# Patient Record
Sex: Male | Born: 1970 | Race: Black or African American | Hispanic: No | Marital: Married | Smoking: Never smoker
Health system: Southern US, Community
[De-identification: ages and names within clinical notes are randomized; demographics above are authoritative.]

---

## 2015-05-06 ENCOUNTER — Encounter (HOSPITAL_COMMUNITY): Payer: Self-pay

## 2015-05-06 ENCOUNTER — Emergency Department (HOSPITAL_COMMUNITY): Payer: BLUE CROSS/BLUE SHIELD

## 2015-05-06 ENCOUNTER — Emergency Department (HOSPITAL_COMMUNITY)
Admission: EM | Admit: 2015-05-06 | Discharge: 2015-05-06 | Disposition: A | Discharge status: Home / Self Care | Payer: BLUE CROSS/BLUE SHIELD | Attending: Emergency Medicine | Admitting: Emergency Medicine

## 2015-05-06 DIAGNOSIS — R51 Headache: Secondary | ICD-10-CM | POA: Diagnosis present

## 2015-05-06 DIAGNOSIS — H53149 Visual discomfort, unspecified: Secondary | ICD-10-CM | POA: Diagnosis not present

## 2015-05-06 DIAGNOSIS — R110 Nausea: Secondary | ICD-10-CM | POA: Diagnosis not present

## 2015-05-06 DIAGNOSIS — R519 Headache, unspecified: Secondary | ICD-10-CM

## 2015-05-06 DIAGNOSIS — Z79899 Other long term (current) drug therapy: Secondary | ICD-10-CM | POA: Diagnosis not present

## 2015-05-06 MED ORDER — DEXAMETHASONE SODIUM PHOSPHATE 10 MG/ML IJ SOLN
10.0000 mg | Freq: Once | INTRAMUSCULAR | Status: AC
  Administered 2015-05-06: 10 mg via INTRAVENOUS
  Filled 2015-05-06: qty 1

## 2015-05-06 MED ORDER — PROCHLORPERAZINE EDISYLATE 5 MG/ML IJ SOLN
10.0000 mg | Freq: Once | INTRAMUSCULAR | Status: AC
  Administered 2015-05-06: 10 mg via INTRAVENOUS
  Filled 2015-05-06: qty 2

## 2015-05-06 MED ORDER — ONDANSETRON 8 MG PO TBDP: 8.0000 mg | ORAL_TABLET | Freq: Three times a day (TID) | ORAL | Status: AC | PRN

## 2015-05-06 MED ORDER — BUTALBITAL-APAP-CAFFEINE 50-325-40 MG PO TABS: 1.0000 | ORAL_TABLET | Freq: Four times a day (QID) | ORAL | Status: AC | PRN

## 2015-05-06 MED ORDER — SODIUM CHLORIDE 0.9 % IV BOLUS (SEPSIS)
1000.0000 mL | Freq: Once | INTRAVENOUS | Status: AC
  Administered 2015-05-06: 1000 mL via INTRAVENOUS

## 2015-05-06 MED ORDER — DIPHENHYDRAMINE HCL 50 MG/ML IJ SOLN
12.5000 mg | Freq: Once | INTRAMUSCULAR | Status: AC
  Administered 2015-05-06: 12.5 mg via INTRAVENOUS
  Filled 2015-05-06: qty 1

## 2015-05-06 NOTE — ED Provider Notes (Signed)
CSN: [id number]     Arrival date & time 05/06/15  1846 History   First MD Initiated Contact with Patient 05/06/15 1855     Chief Complaint  Patient presents with  . Headache     (Consider location/radiation/quality/duration/timing/severity/associated sxs/prior Treatment) The history is provided by the patient and the spouse.   Jared Matthews is a 45 y.o. male presenting with gradual onset headache starting 2 hours before arriving here.  He has had a history of intermittent headaches for the past year only, which he presumes may be migraine, although has never had headaches evaluated.  Every month or 2 will have a headache that incapacitates him for hours until resolution.  He has photophobia and phonophobia without other visual changes (no scotoma) and he endorses nausea without emesis.  He denies fevers, chills, dizziness, focal weakness.  He historically has just taken a nap in a darkened room and the headache would be better upon awaking.  This headache is similar but more intense than normal.  Denies significant family or personal history.  Denies head injury.     History reviewed. No pertinent past medical history. History reviewed. No pertinent past surgical history. No family history on file. Social History  Substance Use Topics  . Smoking status: Never Smoker   . Smokeless tobacco: None  . Alcohol Use: Yes     Comment: occ    Review of Systems  Constitutional: Negative for fever.  HENT: Negative for congestion and sore throat.   Eyes: Positive for photophobia.  Respiratory: Negative for chest tightness and shortness of breath.   Cardiovascular: Negative for chest pain.  Gastrointestinal: Positive for nausea. Negative for vomiting and abdominal pain.  Genitourinary: Negative.   Musculoskeletal: Negative for joint swelling, arthralgias and neck pain.  Skin: Negative.  Negative for rash and wound.  Neurological: Positive for headaches. Negative for dizziness, weakness,  light-headedness and numbness.  Psychiatric/Behavioral: Negative.       Allergies  Review of patient's allergies indicates no known allergies.  Home Medications   Prior to Admission medications   Medication Sig Start Date End Date Taking? Authorizing Provider  loratadine (CLARITIN) 10 MG tablet Take 10 mg by mouth daily as needed for allergies.   Yes Historical Provider, MD  butalbital-acetaminophen-caffeine (FIORICET) 50-325-40 MG tablet Take 1 tablet by mouth every 6 (six) hours as needed for headache. 05/06/15 05/05/16  Burgess Amor, PA-C  ondansetron (ZOFRAN ODT) 8 MG disintegrating tablet Take 1 tablet (8 mg total) by mouth every 8 (eight) hours as needed for nausea or vomiting. 05/06/15   Burgess Amor, PA-C   BP 142/82 mmHg  Pulse 61  Temp(Src) 98.2 F (36.8 C) (Temporal)  Resp 20  Ht  (1.803 m)  Wt 97.523 kg  BMI 30.00 kg/m2  SpO2 100% Physical Exam  Constitutional: He is oriented to person, place, and time. He appears well-developed and well-nourished.  HENT:  Head: Normocephalic and atraumatic.  Mouth/Throat: Oropharynx is clear and moist.  Eyes: EOM are normal. Pupils are equal, round, and reactive to light.  Neck: Normal range of motion. Neck supple.  Cardiovascular: Normal rate and normal heart sounds.   Pulmonary/Chest: Effort normal.  Abdominal: Soft. There is no tenderness.  Musculoskeletal: Normal range of motion.  Lymphadenopathy:    He has no cervical adenopathy.  Neurological: He is alert and oriented to person, place, and time. He has normal strength. No sensory deficit. Gait normal. GCS eye subscore is 4. GCS verbal subscore is 5. GCS motor  subscore is 6.  Normal heel-shin, normal rapid alternating movements. Cranial nerves III-XII intact.  No pronator drift.  Skin: Skin is warm and dry. No rash noted.  Psychiatric: He has a normal mood and affect. His speech is normal and behavior is normal. Thought content normal. Cognition and memory are normal.   Nursing note and vitals reviewed.   ED Course  Procedures (including critical care time) Labs Review   Imaging Review Ct Head Wo Contrast  05/06/2015  CLINICAL DATA:  Headache over the last 2 hours with vomiting. EXAM: CT HEAD WITHOUT CONTRAST TECHNIQUE: Contiguous axial images were obtained from the base of the skull through the vertex without intravenous contrast. COMPARISON:  None. FINDINGS: No acute cortical infarct, hemorrhage, or mass lesion is present. The ventricles are of normal size. No significant extra-axial fluid collection is evident. The paranasal sinuses and mastoid air cells are clear. The calvarium is intact. The globes and orbits are within normal limits. No significant extracranial soft tissue lesion is present. IMPRESSION: Negative CT of the head. Electronically Signed   By: Marin Robertshristopher  Mattern M.D.   On: 05/06/2015 19:53   I have personally reviewed and evaluated these images and lab results as part of my medical decision-making.    MDM   Final diagnoses:  Acute nonintractable headache, unspecified headache type    Pt was given IV fluids, compazine, benadryl and decadron with complete resolution of headache, nausea with no further emesis.  He had one episode of vomiting just prior to receiving injections.  He was alert, comfortable, stable for dc home. Normal neurologic findings on exam, CT negative for acute process.  Suspect migraine headache.  He was given prescription for zofran, Fioricet for prn use. Referral to neurology for further evaluation of headache pattern.      Burgess AmorJulie Finnlee Guarnieri, PA-C 05/07/15 0036  Mancel BaleElliott Wentz, MD 05/07/15 [phone]

## 2015-05-06 NOTE — ED Notes (Signed)
Patient verbalizes understanding of discharge instructions, prescription medications, home care and follow up care. Patient out of department at this time with family. 

## 2015-05-06 NOTE — ED Notes (Signed)
Emesis X1 at this time.  

## 2015-05-06 NOTE — Discharge Instructions (Signed)

## 2015-05-06 NOTE — ED Notes (Signed)
Pt c/o headache x 2 hours.  Reports nausea, no vomiting.  Reports history of migraines.

## 2016-10-20 IMAGING — CT CT HEAD W/O CM
1 series · 16 of 30 positions shown, 20 images · non-contrast
Comparison: None.

CLINICAL DATA: Headache over the last 2 hours with vomiting.

EXAM:
CT HEAD WITHOUT CONTRAST
TECHNIQUE: Contiguous axial images were obtained from the base of the skull
through the vertex without intravenous contrast.

[Series 2: headseq 4.8 h37s · axial · 0.43mm/px · z∈[+84,+241]mm · 16 of 36 slices shown, 20 images]
[im 2/36  brain]
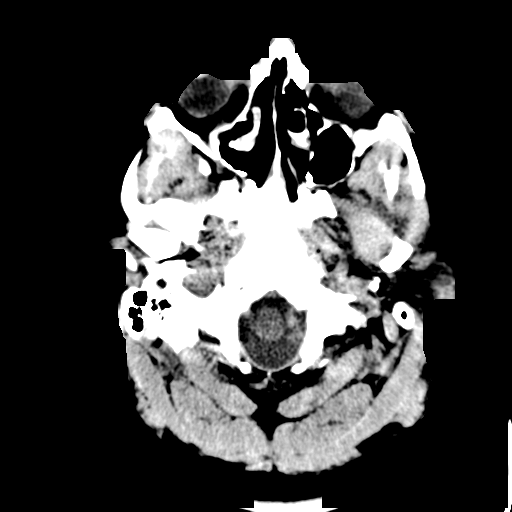
[im 2/36  bone]
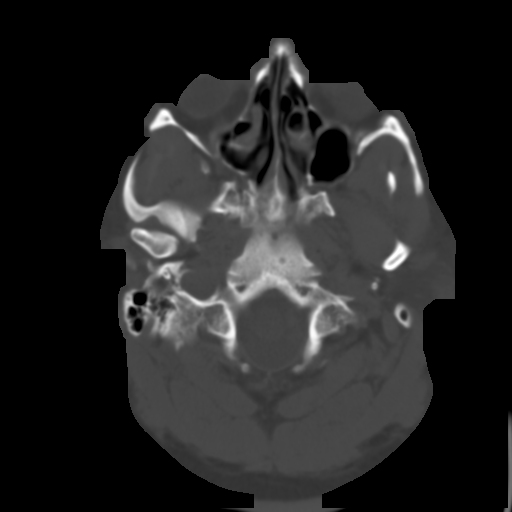
[im 4/36  brain]
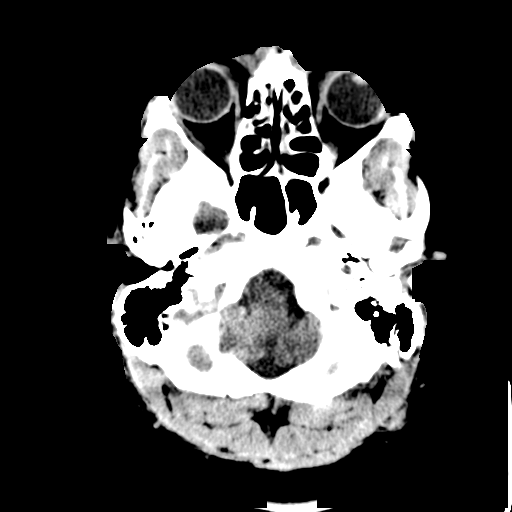
[im 7/36  brain]
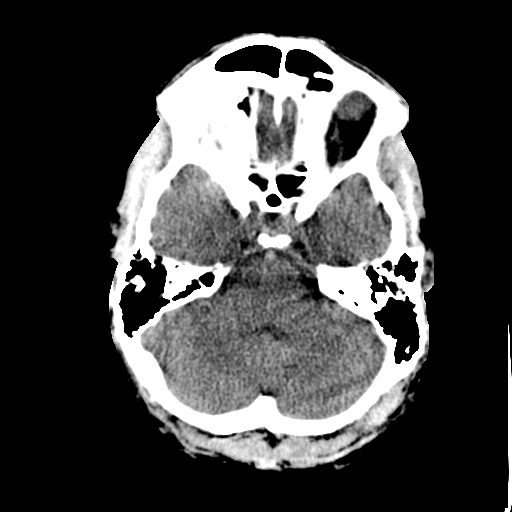
[im 9/36  brain]
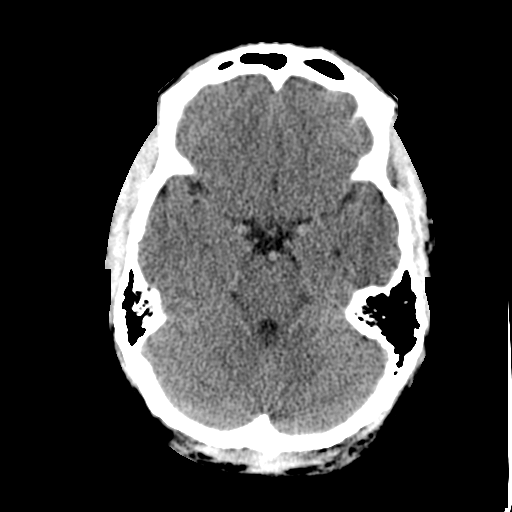
[im 10/36  brain]
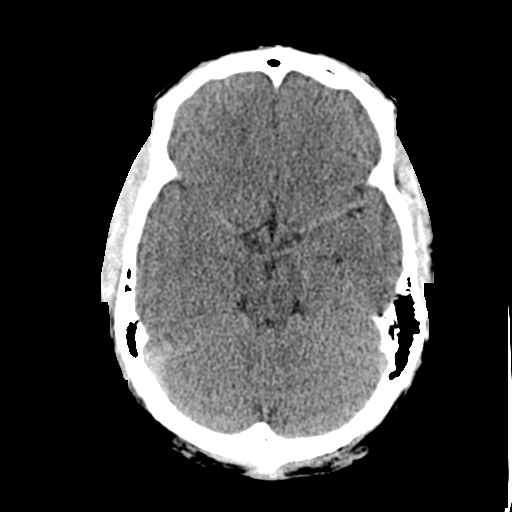
[im 10/36  bone]
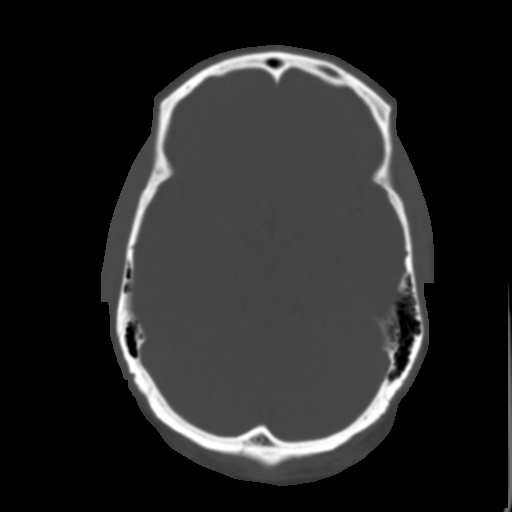
[im 13/36  brain]
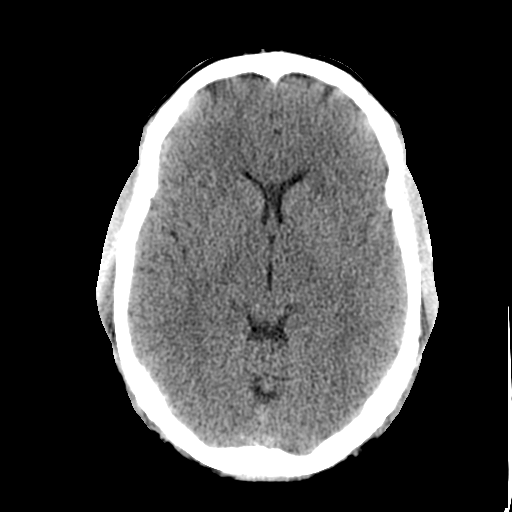
[im 15/36  brain]
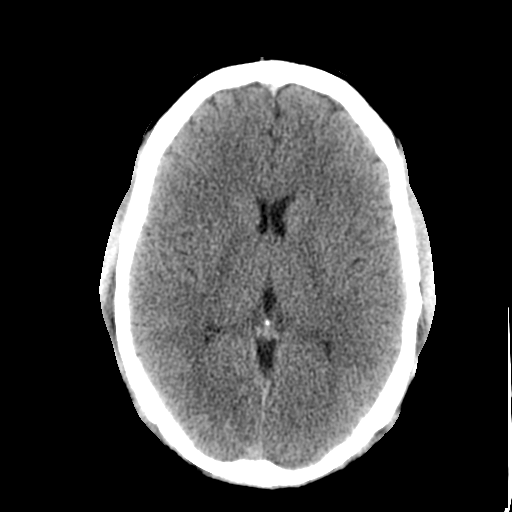
[im 17/36  brain]
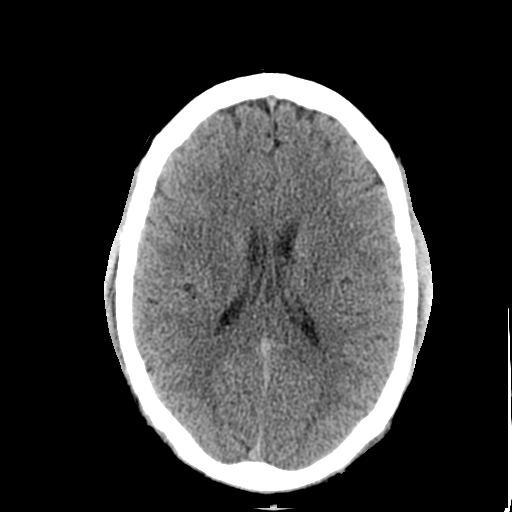
[im 19/36  brain]
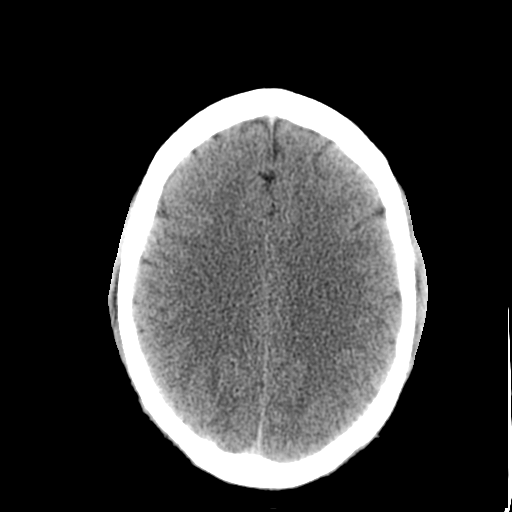
[im 19/36  bone]
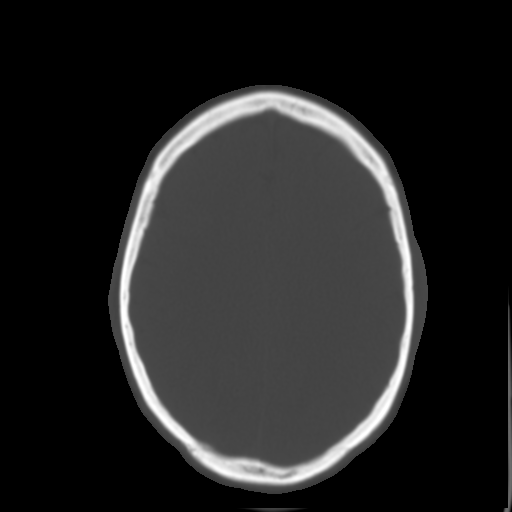
[im 21/36  brain]
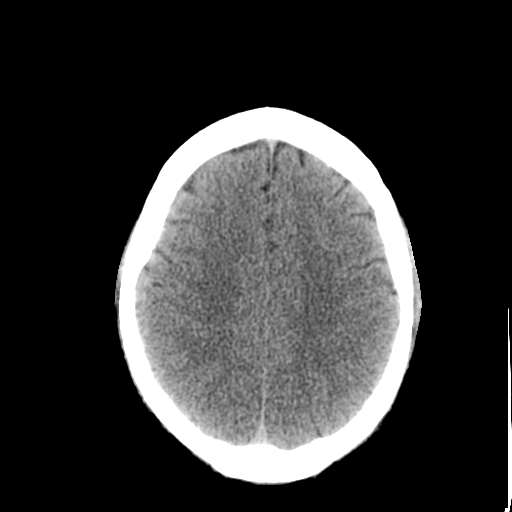
[im 23/36  brain]
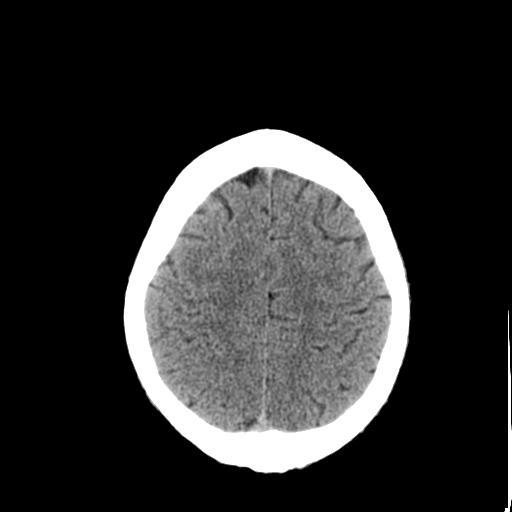
[im 26/36  brain]
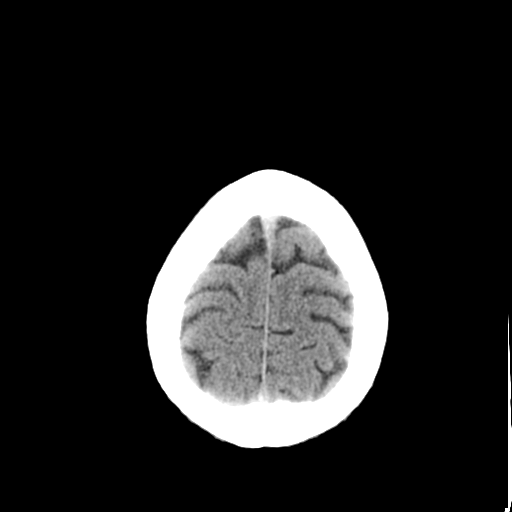
[im 27/36  brain]
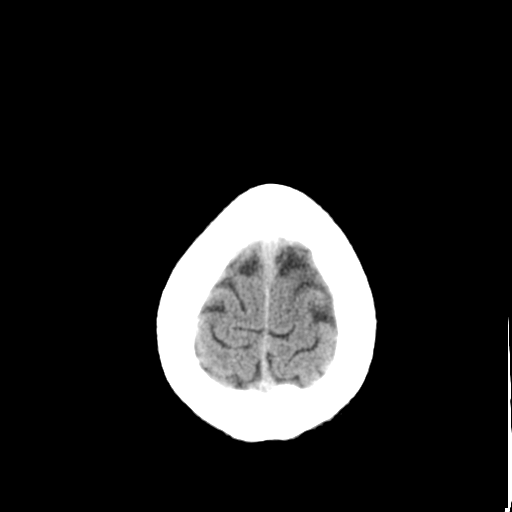
[im 27/36  bone]
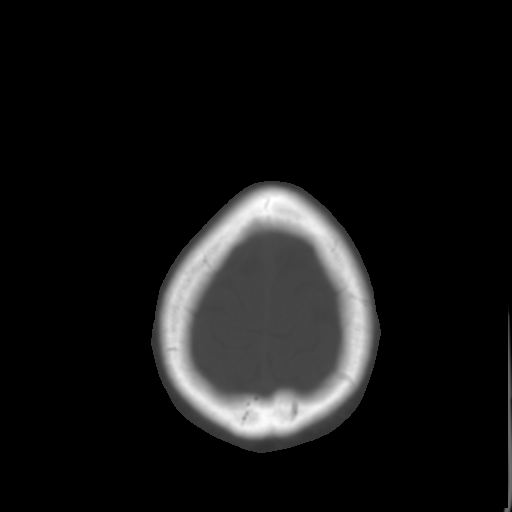
[im 29/36  brain]
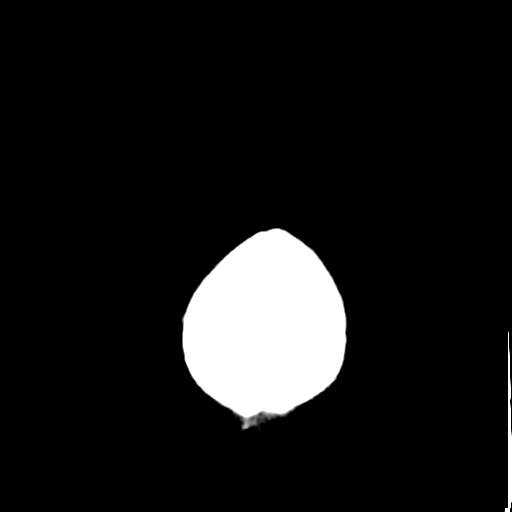
[im 32/36  brain]
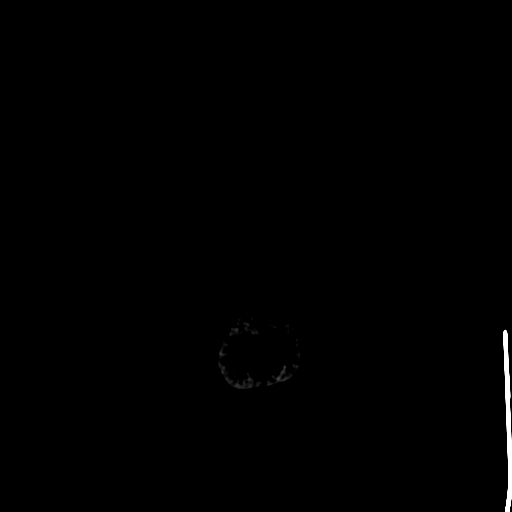
[im 34/36  brain]
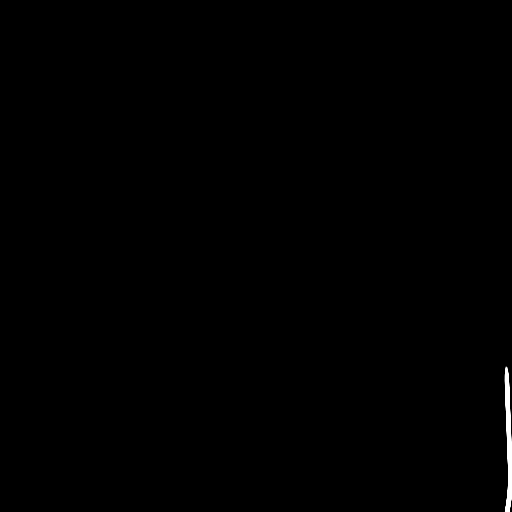

[16 of 30 positions shown; findings below may reference images not displayed]

FINDINGS: No acute cortical infarct, hemorrhage, or mass lesion is present.
The ventricles are of normal size. No significant extra-axial fluid
collection is evident. The paranasal sinuses and mastoid air cells
are clear. The calvarium is intact. The globes and orbits are within
normal limits. No significant extracranial soft tissue lesion is
present.
IMPRESSION: Negative CT of the head.
